# Patient Record
Sex: Male | Born: 1955 | Race: White | Hispanic: No | Smoking: Current every day smoker
Health system: Southern US, Community
[De-identification: ages and names within clinical notes are randomized; demographics above are authoritative.]

## PROBLEM LIST (undated history)

## (undated) DIAGNOSIS — J189 Pneumonia, unspecified organism: Secondary | ICD-10-CM

## (undated) DIAGNOSIS — I219 Acute myocardial infarction, unspecified: Secondary | ICD-10-CM

## (undated) DIAGNOSIS — I2510 Atherosclerotic heart disease of native coronary artery without angina pectoris: Secondary | ICD-10-CM

## (undated) HISTORY — PX: CARDIAC CATHETERIZATION: SHX172

## (undated) HISTORY — PX: OTHER SURGICAL HISTORY: SHX169

## (undated) HISTORY — PX: CORONARY ARTERY BYPASS GRAFT: SHX141

---

## 2005-03-08 ENCOUNTER — Inpatient Hospital Stay (HOSPITAL_COMMUNITY): Admission: AD | Admit: 2005-03-08 | Discharge: 2005-03-17 | Payer: Self-pay | Admitting: Cardiology

## 2005-04-26 ENCOUNTER — Encounter (HOSPITAL_COMMUNITY): Admission: RE | Admit: 2005-04-26 | Discharge: 2005-07-25 | Payer: Self-pay | Admitting: Cardiology

## 2010-07-05 ENCOUNTER — Emergency Department (HOSPITAL_COMMUNITY): Payer: BC Managed Care – PPO

## 2010-07-05 ENCOUNTER — Inpatient Hospital Stay (HOSPITAL_COMMUNITY)
Admission: EM | Admit: 2010-07-05 | Discharge: 2010-07-07 | DRG: 122 | Disposition: A | Discharge status: Home / Self Care | Payer: BC Managed Care – PPO | Attending: Cardiology | Admitting: Cardiology

## 2010-07-05 DIAGNOSIS — Z7982 Long term (current) use of aspirin: Secondary | ICD-10-CM

## 2010-07-05 DIAGNOSIS — R7989 Other specified abnormal findings of blood chemistry: Secondary | ICD-10-CM

## 2010-07-05 DIAGNOSIS — I25810 Atherosclerosis of coronary artery bypass graft(s) without angina pectoris: Secondary | ICD-10-CM | POA: Diagnosis present

## 2010-07-05 DIAGNOSIS — E785 Hyperlipidemia, unspecified: Secondary | ICD-10-CM | POA: Diagnosis present

## 2010-07-05 DIAGNOSIS — I214 Non-ST elevation (NSTEMI) myocardial infarction: Principal | ICD-10-CM | POA: Diagnosis present

## 2010-07-05 DIAGNOSIS — I2510 Atherosclerotic heart disease of native coronary artery without angina pectoris: Secondary | ICD-10-CM | POA: Diagnosis present

## 2010-07-05 DIAGNOSIS — F17200 Nicotine dependence, unspecified, uncomplicated: Secondary | ICD-10-CM | POA: Diagnosis present

## 2010-07-05 LAB — DIFFERENTIAL
Basophils Absolute: 0 10*3/uL (ref 0.0–0.1)
Basophils Relative: 0 % (ref 0–1)
Eosinophils Absolute: 0.2 10*3/uL (ref 0.0–0.7)
Neutrophils Relative %: 58 % (ref 43–77)

## 2010-07-05 LAB — CBC
MCH: 33.5 pg (ref 26.0–34.0)
Platelets: 191 10*3/uL (ref 150–400)
RBC: 4.84 MIL/uL (ref 4.22–5.81)
WBC: 13.7 10*3/uL — ABNORMAL HIGH (ref 4.0–10.5)

## 2010-07-05 LAB — POCT CARDIAC MARKERS
Myoglobin, poc: 37.7 ng/mL (ref 12–200)
Troponin i, poc: 1.18 ng/mL (ref 0.00–0.09)

## 2010-07-05 LAB — BASIC METABOLIC PANEL
BUN: 11 mg/dL (ref 6–23)
Chloride: 105 mEq/L (ref 96–112)
Creatinine, Ser: 0.86 mg/dL (ref 0.4–1.5)

## 2010-07-06 LAB — CARDIAC PANEL(CRET KIN+CKTOT+MB+TROPI)
CK, MB: 8.4 ng/mL (ref 0.3–4.0)
Relative Index: 4.1 — ABNORMAL HIGH (ref 0.0–2.5)
Total CK: 229 U/L (ref 7–232)
Total CK: 191 U/L (ref 7–232)
Troponin I: 2.19 ng/mL (ref 0.00–0.06)
Troponin I: 2.26 ng/mL (ref 0.00–0.06)

## 2010-07-06 LAB — HEPARIN LEVEL (UNFRACTIONATED): Heparin Unfractionated: <0.1 IU/mL — ABNORMAL LOW (ref 0.30–0.70)

## 2010-07-06 LAB — PROTIME-INR: Prothrombin Time: 12.5 seconds (ref 11.6–15.2)

## 2010-07-06 LAB — LIPID PANEL: Triglycerides: 80 mg/dL (ref <150)

## 2010-07-06 LAB — TSH: TSH: 1.62 u[IU]/mL (ref 0.350–4.500)

## 2010-07-07 LAB — CBC
HCT: 44.8 % (ref 39.0–52.0)
MCHC: 35.5 g/dL (ref 30.0–36.0)
MCV: 93.9 fL (ref 78.0–100.0)
RDW: 13 % (ref 11.5–15.5)

## 2010-07-07 LAB — BASIC METABOLIC PANEL
BUN: 9 mg/dL (ref 6–23)
Calcium: 8.9 mg/dL (ref 8.4–10.5)
GFR calc non Af Amer: >60 mL/min (ref >60)
Glucose, Bld: 98 mg/dL (ref 70–99)

## 2010-07-19 NOTE — Discharge Summary (Signed)
  NAME:  Erik Bridges, Erik Bridges             ACCOUNT NO.:  [id number]  MEDICAL RECORD NO.:  [id number]           PATIENT TYPE:  I  LOCATION:  2013                         FACILITY:  [facility]  PHYSICIAN:  Corky Crafts, MDDATE OF BIRTH:  1956/01/21  DATE OF ADMISSION:  07/05/2010 DATE OF DISCHARGE:  07/07/2010                              DISCHARGE SUMMARY   PRIMARY CARDIOLOGIST:  Armanda Magic, MD  FINAL DIAGNOSES: 1. Non-ST segment elevation myocardial infarction. 2. Coronary artery disease. 3. Tobacco abuse. 4. Hyperlipidemia.  PROCEDURE PERFORMED:  Cardiac catheterization by Dr. Verdis Prime, showing ejection fraction of 60% patent LIMA to LAD, patent SVG to PDA, which also provided collaterals to the distal circumflex, SVG to sequential OM, and circumflex was occluded after the anastomosis with the OM.  HOSPITAL COURSE:  The patient had had intermittent chest pain over several days prior to admission.  He went to his primary care doctor's office to check the troponin, and it was significantly elevated.  He came to the emergency room and was admitted.  He underwent cardiac catheterization showing an occluded vein grafts to the distal circumflex territory.  He did not have any further chest pain while in the hospital.  He was put on nicotine patches.  He was put on medical therapy.  He had not been taking any medications at home.  He admits to not being compliant with any of the instructions from his doctor's office.  He did find after the procedure, he had no groin pain or bleeding.  Overall, he felt well.  DISCHARGE MEDICATIONS: 1. Aspirin 325 mg daily. 2. Metoprolol succinate 25 mg daily. 3. Nicotine patch 21 mg per day transdermally daily. 4. Simvastatin 40 mg daily.  FOLLOWUP APPOINTMENTS:  With Dr. Mayford Knife in about 10 days.  ACTIVITY:  No lifting more than 10 pounds for 1 week, will be out of work until at least July 24, 2010.  DIET:  Low-sodium, heart-healthy  diet.  He is to stop smoking and take his medicines as prescribed.  We had a long discussion about this and he understands the importance of take care of himself.  LABORATORY DATA AT DISCHARGE:  LDL 106, HDL 34, triglycerides 80, total cholesterol 156.     Corky Crafts, MD     JSV/MEDQ  D:  07/07/2010  T:  07/08/2010  Job:  [id number]  Electronically Signed by Lance Muss MD on 07/19/2010 02:27:28 PM

## 2010-07-21 NOTE — Cardiovascular Report (Signed)
NAME:  Erik Bridges, Erik Bridges             ACCOUNT NO.:  [id number]  MEDICAL RECORD NO.:  [id number]           PATIENT TYPE:  I  LOCATION:  2013                         FACILITY:  [facility]  PHYSICIAN:  Lyn Records, M.D.   DATE OF BIRTH:  1955-04-09  DATE OF PROCEDURE: DATE OF DISCHARGE:                           CARDIAC CATHETERIZATION   INDICATIONS:  Non-ST-elevation myocardial infarction occurring on July 03, 2010.  PROCEDURE PERFORMED: 1. Left heart catheterization. 2. Selective coronary angiography. 3. Left ventriculography. 4. Saphenous vein graft angiography. 5. Left internal mammary artery graft angiography.  DESCRIPTION:  After informed consent, the patient was brought to the cath lab in the post absorptive state.  A 5-French sheath was placed in the right femoral artery using a modified Seldinger technique.  A 5- [place] A2 multipurpose catheter was used for hemodynamic recordings, left ventriculography by hand injection, saphenous vein graft angiography, and native vessel angiography.  A 5-French internal mammary catheter was used for left internal mammary graft angiography. Nitroglycerin 200 mcg was administered down the saphenous vein, sequential graft to the OM and distal circumflex.  No complications occurred during the procedure.  Angio-Seal was used for hemostasis.  RESULTS: 1. Hemodynamic data:     a.     Left ventricular pressure 108/8 mmHg.     b.     Aortic pressure 108/67 mmHg. 2. Left ventriculography:  Left ventricular cavity size is normal.  EF     is 60%.  There is possible mid inferior wall mild hypokinesis. 3. Coronary angiography.     a.     Left main coronary artery:  Widely patent.     b.     Left anterior descending coronary artery:  Occluded in the      proximal segment.     c.     Circumflex artery:  Occluded in the mid segment.     d.     Right coronary artery:  Occluded proximally. 4. Bypass graft angiography.     a.     LIMA graft to LAD:   Widely patent, LAD wraps around the left      ventricular apex.  Proximal flow to the graft insertion site is      also noted back to the proximal LAD segment.     b.     Saphenous vein graft to the PDA:  Widely patent.  The PDA      supplies collaterals to the distal circumflex.  This helps to      visualize the occluded graft segment noted beyond the first obtuse      marginal.     c.     Saphenous vein graft sequential to OM and distal circumflex:      This graft is patent and an anastomosis to the first obtuse      marginal in a side-to-side fashion.  The graft is occluded beyond      this side-to-side anastomoses.  As mentioned earlier, the distal      circumflex is supplied by collaterals from the right coronary      territory.  CONCLUSIONS: 1. Bypass graft occlusion with  100% obstruction of the distal limb of     the saphenous vein graft to the circumflex. 2. Patent saphenous vein graft to the right coronary and LIMA to the     LAD. 3. Overall normal LV function with EF of 55-60%. 4. Severe native vessel coronary artery disease with total occlusion     of the LAD, circumflex, and right coronary.  PLAN:  The patient will be in the hospital overnight.  He should have aggressive risk factor modification.  I have counseled him about smoking cessation.  We will get cardiac rehab to work with him.     Lyn Records, M.D.     HWS/MEDQ  D:  07/06/2010  T:  07/07/2010  Job:  [id number]  cc:   Armanda Magic, M.D.  Electronically Signed by Verdis Prime M.D. on 07/21/2010 04:57:38 PM

## 2010-07-28 NOTE — H&P (Signed)
NAME:  Erik Bridges, Erik Bridges             ACCOUNT NO.:  [id number]  MEDICAL RECORD NO.:  [id number]           PATIENT TYPE:  E  LOCATION:  [facility]                         FACILITY:  [facility]  PHYSICIAN:  Rollene Rotunda, MD, FACCDATE OF BIRTH:  05-Sep-1955  DATE OF ADMISSION:  07/05/2010 DATE OF DISCHARGE:                             HISTORY & PHYSICAL   PRIMARY CARE PHYSICIAN:  Robert L. Foy Guadalajara, MD  CARDIOLOGIST:  Armanda Magic, MD  REASON FOR PRESENTATION:  Evaluate the patient with elevated cardiac enzymes.  HISTORY OF PRESENT ILLNESS:  The patient is a pleasant 55 year old gentleman with a past history of coronary artery disease and failed angioplasty in 2006.  He had subsequent CABG.  Since that time, he has not really been participating in secondary risk reduction.  He says he has a lot of stress in his life.  He still smokes a pack of cigarettes per day.  On Monday, he was not doing anything particularly exerting. He had an episode of chest discomfort.  It was substernal.  It was 7/10 in intensity.  It was not like his previous angina.  He did not have associated diaphoresis, nausea, or vomiting.  He did not have any particular shortness of breath.  There was some jaw discomfort.  He subsequently presented then to his primary provider.  Cardiac enzymes were drawn yesterday.  Apparently, these were elevated and he was called and told to come to the emergency room.  He has had no further chest discomfort.  He has been back to work.  He has had no new shortness of breath, PND, or orthopnea.  He has had no palpitations, presyncope, or syncope.  Of note, his cardiac enzymes are mildly elevated at 1.10 here in the emergency room.  EKG demonstrates some subtle inferior ST-segment elevation, not diagnostic.  PAST MEDICAL HISTORY:  Coronary artery disease.  PAST SURGICAL HISTORY:  CABG in 2006 (LIMA to the LAD, SVG to the PDA, SVG to OM/circumflex), jaw abscess drained.  ALLERGIES:   None.  MEDICATIONS:  Aspirin intermittently.  SOCIAL HISTORY:  The patient is going through separation.  He is in the room with his children.  He smokes 1 pack per day and has done so for 35 years.  He works Interior and spatial designer.  FAMILY HISTORY:  Contributory for his father having bypass at age 54.  REVIEW OF SYSTEMS:  As stated in HPI, otherwise negative for all other systems.  PHYSICAL EXAMINATION:  GENERAL:  The patient is a pleasant and in no distress. VITAL SIGNS:  Blood pressure 111/64, heart rate 79 and regular, afebrile, respiratory rate 16. HEENT: Eyes unremarkable.  Pupils equal, round, and reactive to light. Fundi not visualized.  Oral mucosa unremarkable. NECK:  No jugular venous distention at 45 degrees.  Carotid upstroke brisk and symmetric.  No bruits, no thyromegaly. LYMPHATICS:  No cervical, axillary, or inguinal adenopathy. LUNGS:  Clear to auscultation bilaterally. BACK:  No costovertebral tenderness. CHEST:  Well-healed sternotomy scar. HEART:  PMI not displaced or sustained.  S1 and S2 within normal limits. No S3, no S4, no clicks, no rubs, no murmurs. ABDOMEN:  Flat.  Positive bowel  sounds.  Normal frequency and pitch.  No bruits, no rebound, no guarding, no midline pulsatile mass, no hepatomegaly, no splenomegaly. SKIN:  No rashes, no nodules.Marland Kitchen EXTREMITIES:  2+ pulses throughout.  No edema, cyanosis, or clubbing. NEUROLOGIC:  Oriented to person, place, and time.  Cranial nerves II-XII grossly intact. Motor grossly intact.  EKG, sinus rhythm, rate 85, axis within normal limits, intervals within normal limits, inferior T-wave inversions considered with possible ischemia.  LABS:  Sodium 135, potassium 3.7, BUN 11, creatinine 0.86.  WBC 13.7, hemoglobin 9.2, platelets 191.  Point-of-care markers MB 7.1, troponin 1.16.  Chest x-ray, no acute disease.  ASSESSMENT AND PLAN: 1. Out-of-hospital myocardial infarction.  The patient seems to have     had an  out-of-hospital non-Q-wave myocardial infarction.  He is     currently pain free.  He has significant ongoing risk factors.  He     will be admitted.  I will use heparin, aspirin, and a very low-dose     beta blocker.  He will need cardiac catheterization for further     evaluation.  I will take the liberty of scheduling this with the     [facility]. 2. Tobacco.  We had a long discussion about how to stop smoking and     the need to do this and hopefully he could commit to this. 3. Dyslipidemia.  He says his lipids were fine in the past.  I will     check a lipid profile.  We will empirically start a statin.     Rollene Rotunda, MD, [facility]     JH/MEDQ  D:  07/06/2010  T:  07/06/2010  Job:  [id number]  cc:   Molly Maduro L. Foy Guadalajara, M.D.  Electronically Signed by Rollene Rotunda MD [facility] on 07/28/2010 11:46:19 AM

## 2011-05-04 ENCOUNTER — Encounter (INDEPENDENT_AMBULATORY_CARE_PROVIDER_SITE_OTHER): Payer: Self-pay

## 2011-05-04 ENCOUNTER — Ambulatory Visit (INDEPENDENT_AMBULATORY_CARE_PROVIDER_SITE_OTHER): Payer: BC Managed Care – PPO | Admitting: General Surgery

## 2011-05-04 ENCOUNTER — Encounter (INDEPENDENT_AMBULATORY_CARE_PROVIDER_SITE_OTHER): Payer: Self-pay | Admitting: General Surgery

## 2011-05-04 VITALS — BP 140/86 | HR 70 | Temp 97.6°F | Resp 16 | Ht 69.0 in | Wt 147.2 lb

## 2011-05-04 DIAGNOSIS — K4090 Unilateral inguinal hernia, without obstruction or gangrene, not specified as recurrent: Secondary | ICD-10-CM

## 2011-05-04 NOTE — Progress Notes (Signed)
Patient ID: Erik Bridges, male   DOB: [birth year], 56 y.o.   MRN: [id number]  Chief Complaint  Patient presents with  . Pain    Evaluate Righ inguinal hernia    HPI Erik Bridges is a 56 y.o. male.   HPI This patient presents for evaluation of a right inguinal bulge. He states that he has noticed this for several years and has not caused him any problems or discomfort although he states that is increasing in size. He states it is reducible. He denies any obstructive symptoms. Of note, he did have a 2 vessel bypass surgery in 2006 and had a "minor heart attack" last April of 2012. He states he is active now and recently went snowboarding last week and denies any chest pain. He is followed currently by Dr. Mayford Knife.  No past medical history on file. PMH: CAD and MI  No past surgical history on file. PSH: cabg   No family history on file.  Social History History  Substance Use Topics  . Smoking status: Current Everyday Smoker    Types: Cigarettes  . Smokeless tobacco: Not on file  . Alcohol Use: 0.5 - 1.0 oz/week    1-2 drink(s) per week    Allergies not on file  No current outpatient prescriptions on file.    Review of Systems Review of Systems All other review of systems negative or noncontributory except as stated in the HPI   Blood pressure 140/86, pulse 70, temperature 97.6 F (36.4 C), temperature source Temporal, resp. rate 16, height 5\' 9"  (1.753 m), weight 147 lb 3.2 oz (66.769 kg).  Physical Exam Physical Exam Physical Exam  Vitals reviewed. Constitutional: He is oriented to person, place, and time. He appears well-developed and well-nourished. No distress.  HENT:  Head: Normocephalic and atraumatic.  Mouth/Throat: No oropharyngeal exudate.  Eyes: Conjunctivae and EOM are normal. Pupils are equal, round, and reactive to light. Right eye exhibits no discharge. Left eye exhibits no discharge. No scleral icterus.  Neck: Normal range of motion. No tracheal  deviation present.  Cardiovascular: Normal rate, regular rhythm and normal heart sounds.   Pulmonary/Chest: Effort normal and breath sounds normal. No stridor. No respiratory distress. He has no wheezes. He has no rales. He exhibits no tenderness.  Abdominal: Soft. Bowel sounds are normal. He exhibits no distension and no mass. There is no tenderness. There is no rebound and no guarding. Small to moderate size reducible RIH, no LIH. Musculoskeletal: Normal range of motion. He exhibits no edema and no tenderness.  Neurological: He is alert and oriented to person, place, and time.  Skin: Skin is warm and dry. No rash noted. He is not diaphoretic. No erythema. No pallor.  Psychiatric: He has a normal mood and affect. His behavior is normal. Judgment and thought content normal.   Data Reviewed   Assessment    Reducible right inguinal hernia  He does have a small to moderate-sized reducible right inguinal hernia on exam. This is currently asymptomatic but he states it is enlarging in size and this is recently lost his insurance and would like to have this repaired soon. I discussed with him the options for lateral waiting versus laparoscopic or open repair and he would like to have this repaired. We discussed the risks of procedure including infection, bleeding, pain, scarring, recurrent hernia, injury to testicle or vas deferens, nerve injury and chronic pain and he expressed understanding and desire to proceed with open inguinal hernia repair. Given the fact  that he had recently had a heart attack, have requested that he get clearance from his cardiologist Dr. Mayford Knife prior to scheduling this procedure. I recommended open repair as this would give anesthesia more options including the possibility of possibly doing this with local and sedation if he is to risk from a cardiac standpoint.    Plan    We will plan for open right inguinal hernia repair with mesh after he received his cardiac clearance.        Lodema Pilot DAVID 05/04/2011, 2:06 PM

## 2011-05-19 NOTE — Pre-Procedure Instructions (Signed)
[address] Carly[address] Mcelrath Beldin  05/19/2011     Your procedure is scheduled on:  Tuesday, Feb. 19th  Report to [facility] at  8:15 AM.   Call this number if you have problems the morning of surgery: [phone]   Remember:   Do not eat food:After Midnight Monday.   May have clear liquids: up to 4 Hours before arrival time-- 4:15 AM.  Clear liquids include soda, tea, black coffee, apple or grape juice, broth.   Take these medicines the morning of surgery with A SIP OF WATER: nothing   Do not wear jewelry, Do not wear lotions, powders, or perfumes. You may wear deodorant.    Do not bring valuables to the hospital.   Contacts, dentures or bridgework may not be worn into surgery.   Leave suitcase in the car. After surgery it may be brought to your room.  For patients admitted to the hospital, checkout time is 11:00 AM the day of discharge.   Patients discharged the day of surgery will not be allowed to drive home, and will              Need someone to stay with them for the first 24 hrs.   Name and phone number of your driver:  DARRYL  Bertino ---  SON   Special Instructions: CHG Shower Use Special Wash: 1/2 bottle night before surgery and 1/2 bottle morning of surgery.   Please read over the following fact sheets that you were given: Pain Booklet, MRSA Information and Surgical Site Infection Prevention

## 2011-05-21 ENCOUNTER — Encounter (HOSPITAL_COMMUNITY)
Admission: RE | Admit: 2011-05-21 | Discharge: 2011-05-21 | Disposition: A | Discharge status: Home / Self Care | Payer: BC Managed Care – PPO | Source: Ambulatory Visit | Attending: General Surgery | Admitting: General Surgery

## 2011-05-21 ENCOUNTER — Encounter (HOSPITAL_COMMUNITY): Payer: Self-pay

## 2011-05-21 DIAGNOSIS — K4090 Unilateral inguinal hernia, without obstruction or gangrene, not specified as recurrent: Secondary | ICD-10-CM

## 2011-05-21 HISTORY — DX: Acute myocardial infarction, unspecified: I21.9

## 2011-05-21 HISTORY — DX: Atherosclerotic heart disease of native coronary artery without angina pectoris: I25.10

## 2011-05-21 LAB — COMPREHENSIVE METABOLIC PANEL
ALT: 13 U/L (ref 0–53)
Albumin: 4.3 g/dL (ref 3.5–5.2)
Calcium: 9.6 mg/dL (ref 8.4–10.5)
GFR calc Af Amer: >90 mL/min (ref >90)
Glucose, Bld: 91 mg/dL (ref 70–99)
Potassium: 4.2 mEq/L (ref 3.5–5.1)
Sodium: 139 mEq/L (ref 135–145)
Total Protein: 7.8 g/dL (ref 6.0–8.3)

## 2011-05-21 LAB — SURGICAL PCR SCREEN
MRSA, PCR: NEGATIVE
Staphylococcus aureus: NEGATIVE

## 2011-05-21 LAB — CBC
HCT: 48.7 % (ref 39.0–52.0)
Hemoglobin: 16.8 g/dL (ref 13.0–17.0)
MCHC: 34.5 g/dL (ref 30.0–36.0)
MCV: 94.9 fL (ref 78.0–100.0)
WBC: 11.1 10*3/uL — ABNORMAL HIGH (ref 4.0–10.5)

## 2011-05-21 MED ORDER — CEFAZOLIN SODIUM 1-5 GM-% IV SOLN
1.0000 g | INTRAVENOUS | Status: DC
  Filled 2011-05-21: qty 50

## 2011-05-21 NOTE — Progress Notes (Signed)
PATIENT STATES HE'S BEEN FINE .Marland KitchenNO CHEST PAIN, ANGINA... HE DID SAY HE THINKS HE HAD STRESS TEST OVER AT [facility].  UNABLE TO LOCATE ANY  INFO IN EPIC..   I HAVE CALLED THE MED RECORDS DEPT TO SEE IF THEY HAVE ANYTHING......

## 2011-05-22 ENCOUNTER — Encounter (HOSPITAL_COMMUNITY): Payer: Self-pay

## 2011-05-22 ENCOUNTER — Encounter (HOSPITAL_COMMUNITY): Payer: Self-pay | Admitting: Anesthesiology

## 2011-05-22 ENCOUNTER — Ambulatory Visit (HOSPITAL_COMMUNITY)
Admission: RE | Admit: 2011-05-22 | Discharge: 2011-05-22 | Disposition: A | Discharge status: Home / Self Care | Payer: BC Managed Care – PPO | Source: Ambulatory Visit | Attending: General Surgery | Admitting: General Surgery

## 2011-05-22 ENCOUNTER — Encounter (HOSPITAL_COMMUNITY)
Admission: RE | Disposition: A | Discharge status: Home / Self Care | Payer: Self-pay | Source: Ambulatory Visit | Attending: General Surgery

## 2011-05-22 DIAGNOSIS — Z538 Procedure and treatment not carried out for other reasons: Secondary | ICD-10-CM | POA: Insufficient documentation

## 2011-05-22 DIAGNOSIS — K4090 Unilateral inguinal hernia, without obstruction or gangrene, not specified as recurrent: Secondary | ICD-10-CM

## 2011-05-22 SURGERY — REPAIR, HERNIA, INGUINAL, ADULT
Anesthesia: General

## 2011-05-22 SURGICAL SUPPLY — 43 items
ADH SKN CLS APL DERMABOND .7 (GAUZE/BANDAGES/DRESSINGS)
BLADE SURG 10 STRL SS (BLADE) ×1 IMPLANT
BLADE SURG 15 STRL LF DISP TIS (BLADE) ×1 IMPLANT
BLADE SURG 15 STRL SS (BLADE)
BLADE SURG ROTATE 9660 (MISCELLANEOUS) IMPLANT
CANISTER SUCTION 2500CC (MISCELLANEOUS) ×1 IMPLANT
CHLORAPREP W/TINT 26ML (MISCELLANEOUS) ×1 IMPLANT
CLOTH BEACON ORANGE TIMEOUT ST (SAFETY) ×1 IMPLANT
COVER SURGICAL LIGHT HANDLE (MISCELLANEOUS) ×1 IMPLANT
DERMABOND ADVANCED (GAUZE/BANDAGES/DRESSINGS)
DERMABOND ADVANCED .7 DNX12 (GAUZE/BANDAGES/DRESSINGS) ×1 IMPLANT
DRAIN PENROSE 1/2X12 LTX STRL (WOUND CARE) IMPLANT
DRAPE LAPAROSCOPIC ABDOMINAL (DRAPES) ×1 IMPLANT
ELECT CAUTERY BLADE 6.4 (BLADE) ×1 IMPLANT
ELECT REM PT RETURN 9FT ADLT (ELECTROSURGICAL)
ELECTRODE REM PT RTRN 9FT ADLT (ELECTROSURGICAL) ×1 IMPLANT
GLOVE SURG SS PI 7.5 STRL IVOR (GLOVE) ×2 IMPLANT
GOWN PREVENTION PLUS XLARGE (GOWN DISPOSABLE) ×1 IMPLANT
GOWN STRL NON-REIN LRG LVL3 (GOWN DISPOSABLE) ×1 IMPLANT
KIT BASIN OR (CUSTOM PROCEDURE TRAY) ×1 IMPLANT
KIT ROOM TURNOVER OR (KITS) ×1 IMPLANT
NDL HYPO 25GX1X1/2 BEV (NEEDLE) ×1 IMPLANT
NEEDLE HYPO 25GX1X1/2 BEV (NEEDLE) IMPLANT
NS IRRIG 1000ML POUR BTL (IV SOLUTION) ×1 IMPLANT
PACK SURGICAL SETUP 50X90 (CUSTOM PROCEDURE TRAY) ×1 IMPLANT
PAD ARMBOARD 7.5X6 YLW CONV (MISCELLANEOUS) ×2 IMPLANT
PENCIL BUTTON HOLSTER BLD 10FT (ELECTRODE) ×1 IMPLANT
SPECIMEN JAR SMALL (MISCELLANEOUS) IMPLANT
SPONGE INTESTINAL PEANUT (DISPOSABLE) ×1 IMPLANT
SPONGE LAP 18X18 X RAY DECT (DISPOSABLE) ×1 IMPLANT
SUT MNCRL AB 4-0 PS2 18 (SUTURE) ×1 IMPLANT
SUT PROLENE 2 0 SH DA (SUTURE) ×4 IMPLANT
SUT VIC AB 2-0 SH 27 (SUTURE)
SUT VIC AB 2-0 SH 27XBRD (SUTURE) ×2 IMPLANT
SUT VIC AB 3-0 SH 27 (SUTURE)
SUT VIC AB 3-0 SH 27X BRD (SUTURE) ×1 IMPLANT
SYR BULB 3OZ (MISCELLANEOUS) ×1 IMPLANT
SYR CONTROL 10ML LL (SYRINGE) ×1 IMPLANT
TOWEL OR 17X24 6PK STRL BLUE (TOWEL DISPOSABLE) ×1 IMPLANT
TOWEL OR 17X26 10 PK STRL BLUE (TOWEL DISPOSABLE) ×1 IMPLANT
TUBE CONNECTING 12X1/4 (SUCTIONS) IMPLANT
WATER STERILE IRR 1000ML POUR (IV SOLUTION) IMPLANT
YANKAUER SUCT BULB TIP NO VENT (SUCTIONS) IMPLANT

## 2011-05-22 NOTE — Progress Notes (Signed)
Report to Dr. Chaney Malling regarding correspondence rec'd fr. Dr. Mayford Knife. Dr. Chaney Malling asked that pt. be held in [facility], he will contact Dr. Biagio Quint for next step to be followed.

## 2011-05-22 NOTE — Progress Notes (Signed)
Spoke with Amy in [facility], requested cardiac note & any stress/echo that would be avail.

## 2011-05-22 NOTE — Progress Notes (Signed)
Call to Dr. Chaney Malling, reported pt. Remark that he has not been taking Metoprolol for 2 months ( for financial reasons has not filled Rx) .  Further, pt. remarks Dr. Mayford Knife told Dr. Biagio Quint that he may need Nitro IV intraoperatively.   Call again to [facility], spoke with Burna Mortimer, requested correspondence with Dr. Mayford Knife to Dr. Biagio Quint for surg. clearance. Spoke with AMaureen Chatters regarding this situation as well.

## 2011-05-22 NOTE — Progress Notes (Addendum)
Pt. Seen by Dr. Biagio Quint, decision reached to cancel surgery.  Pt. Left with his son to follow up with taking medicine as previously rx'd by cardiologist.  Surgery was cancelled by anesthesia because the patient was high risk and he had been noncompliant with metoprolol.  He has a small asymptomatic hernia and we discussed the signs of incarceration and strangulation. He will return if any of these and if he desires elective repair, he will need to reestablish with his cardiologist for optimization.

## 2012-09-23 IMAGING — CR DG CHEST 2V
2 series · 2 of 2 positions shown · non-contrast
Comparison: 03/16/2005

CLINICAL DATA: Chest pain

CHEST - 2 VIEW

[w chest pa]
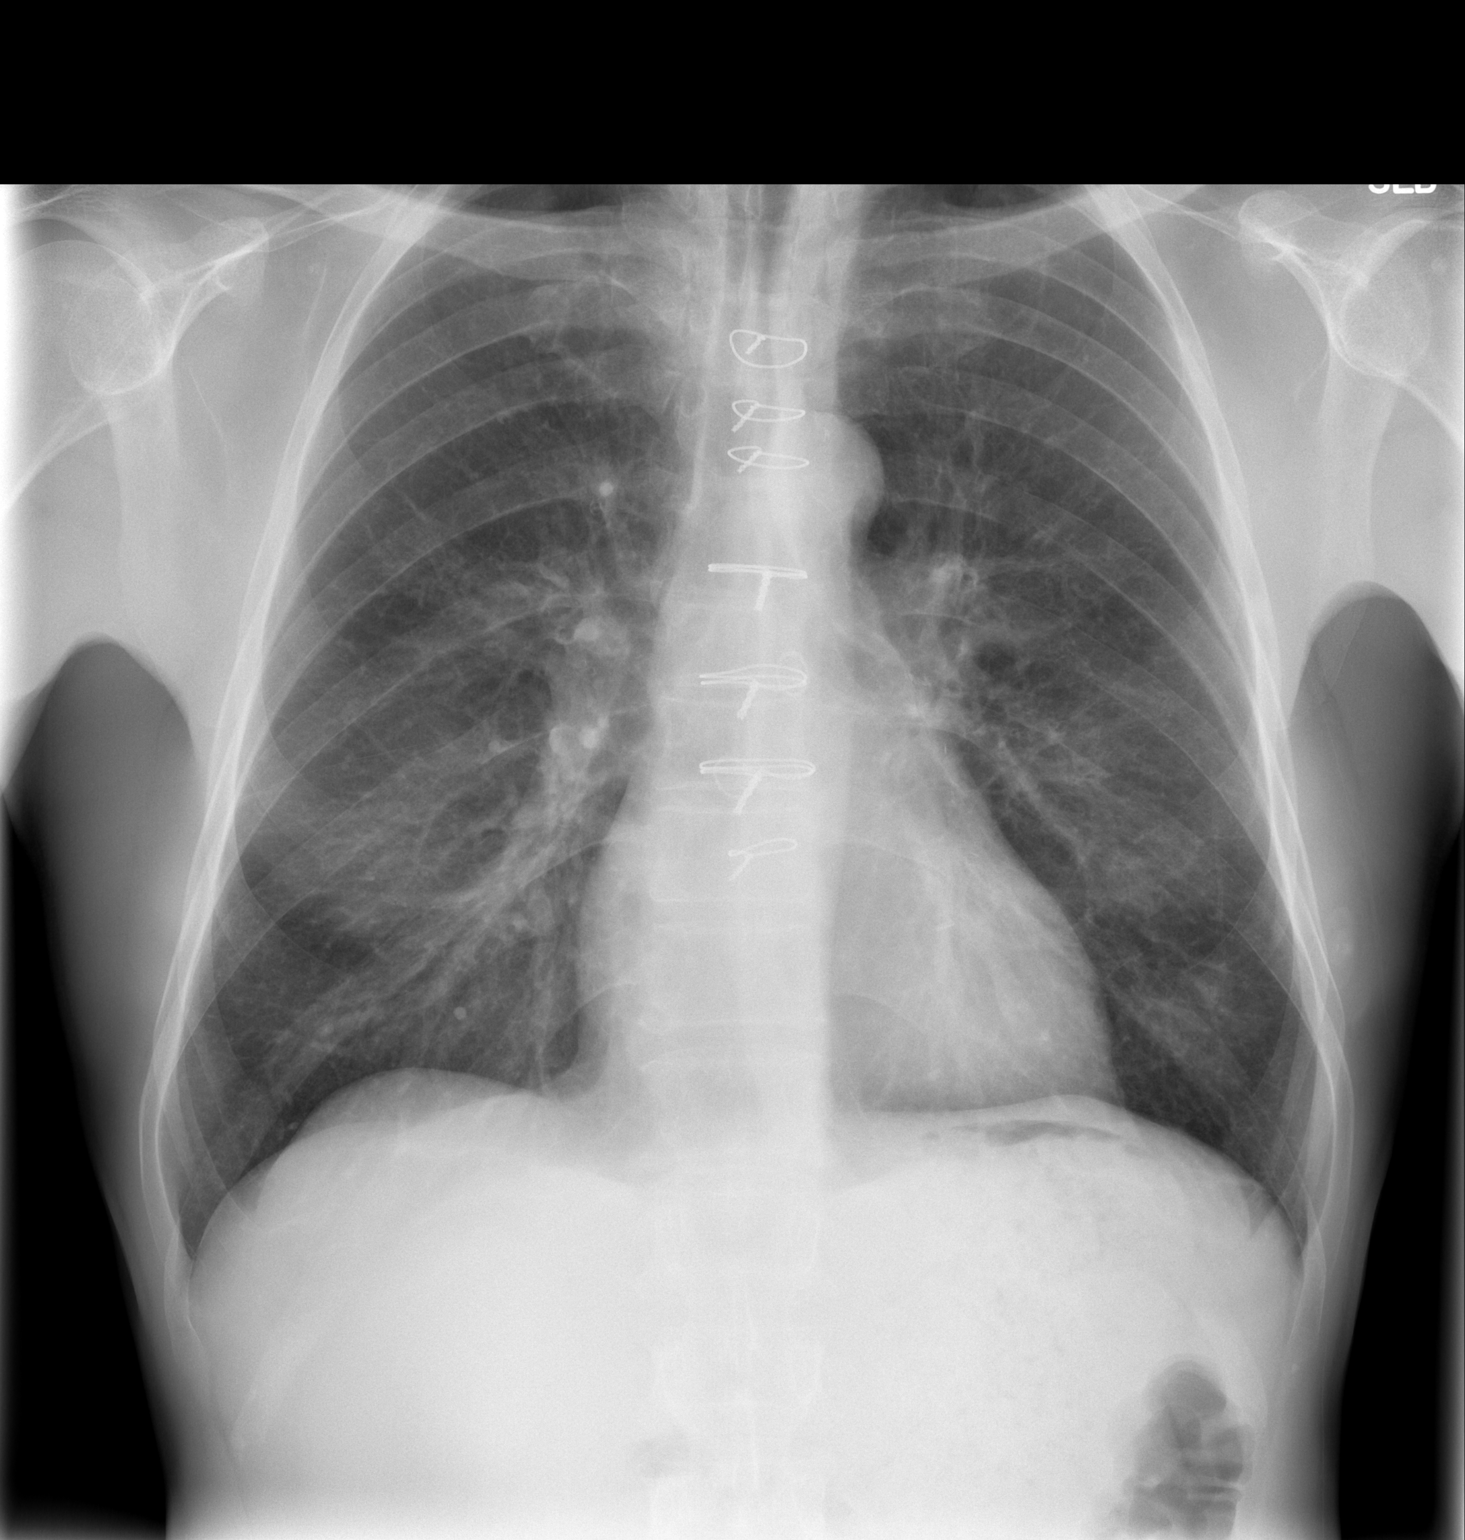

[w chest lat]
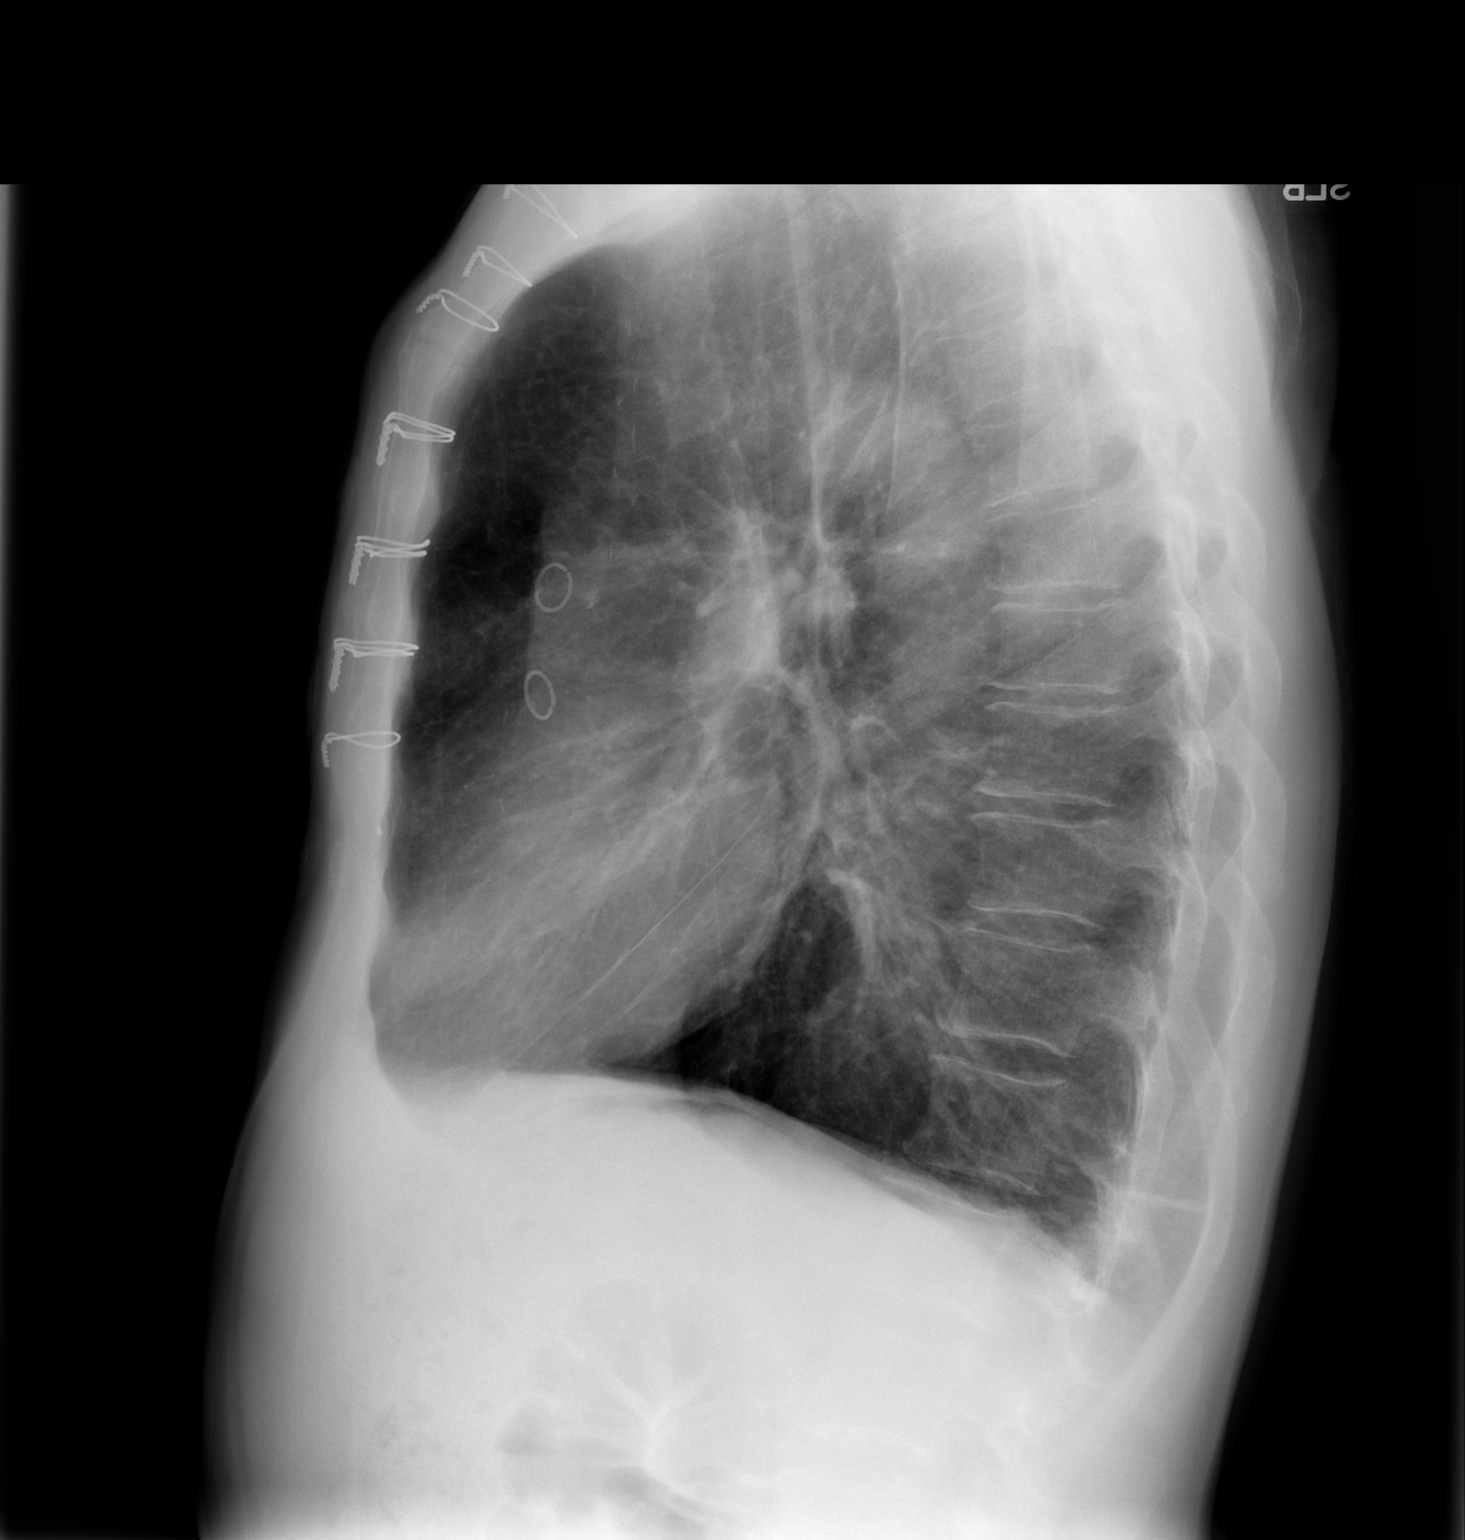

[2 of 2 positions shown; findings below may reference images not displayed]

FINDINGS: Status post median sternotomy and CABG procedure The
heart size and mediastinal contours are within normal limits.  Both
lungs are clear.  The visualized skeletal structures are
unremarkable.
IMPRESSION: 1.  No acute findings.

## 2014-04-15 ENCOUNTER — Encounter (HOSPITAL_COMMUNITY): Payer: Self-pay | Admitting: General Surgery

## 2020-06-08 ENCOUNTER — Ambulatory Visit: Payer: Self-pay | Admitting: General Surgery

## 2020-06-08 NOTE — H&P (View-Only) (Signed)
History of Present Illness Erik Filler MD; 06/08/2020 2:14 PM) The patient is a 65 year old male who presents with an inguinal hernia. Chief Complaint: Bilateral inguinal hernias  Patient is a 65 year old male, with a history of PVD, status post aortobifem bypass, who comes in with bilateral inguinal hernias. Patient states his right inguinal hernias been there for several years. He states that to 3 months ago a left inguinal hernia.Marland Kitchen He states this is slowly getting larger. He states that he has been wearing a bilateral truss to help with the discomfort. Patient is self-employed and works as a Curator.  Patient does smoke 1 pack per day.  Patient had no signs or symptoms of incarceration or granulation. Patient has a large midline incision, with bilateral femoral incisions.    Past Surgical History Rosezella Florida, RN; 06/08/2020 1:58 PM) Bypass Surgery for Poor Blood Flow to Legs   Diagnostic Studies History Rosezella Florida, RN; 06/08/2020 1:58 PM) Colonoscopy  never  Allergies Rosezella Florida, RN; 06/08/2020 1:58 PM) No Known Drug Allergies  [06/08/2020]: Allergies Reconciled   Medication History (Diane Herrin, RN; 06/08/2020 2:00 PM) Metoprolol Succinate ER (25MG  Tablet ER 24HR, Oral) Active. Simvastatin (40MG  Tablet, Oral) Active. Aspirin (81MG  Tablet Chewable, Oral) Active. Nitroglycerin (0.4MG  Tab Sublingual, Sublingual) Active. Medications Reconciled  Social History , RN; 06/08/2020 1:58 PM) Alcohol use  Moderate alcohol use. Caffeine use  Coffee. Illicit drug use  Uses socially only. Tobacco use  Current every day smoker.  Family History , RN; 06/08/2020 1:58 PM) Heart disease in male family member before age 73   Other Problems Rosezella Florida, RN; 06/08/2020 1:58 PM) Inguinal Hernia  Myocardial infarction     Review of Systems 54 MD; 06/08/2020 2:12 PM) General Not Present- Appetite Loss, Chills, Fatigue, Fever, Night  Sweats, Weight Gain and Weight Loss. Skin Not Present- Change in Wart/Mole, Dryness, Hives, Jaundice, New Lesions, Non-Healing Wounds, Rash and Ulcer. HEENT Not Present- Earache, Hearing Loss, Hoarseness, Nose Bleed, Oral Ulcers, Ringing in the Ears, Seasonal Allergies, Sinus Pain, Sore Throat, Visual Disturbances, Wears glasses/contact lenses and Yellow Eyes. Respiratory Not Present- Bloody sputum, Chronic Cough, Difficulty Breathing, Snoring and Wheezing. Cardiovascular Not Present- Chest Pain, Difficulty Breathing Lying Down, Leg Cramps, Palpitations, Rapid Heart Rate, Shortness of Breath and Swelling of Extremities. Gastrointestinal Not Present- Abdominal Pain, Bloating, Bloody Stool, Change in Bowel Habits, Chronic diarrhea, Constipation, Difficulty Swallowing, Excessive gas, Gets full quickly at meals, Hemorrhoids, Indigestion, Nausea, Rectal Pain and Vomiting. Male Genitourinary Not Present- Blood in Urine, Change in Urinary Stream, Frequency, Impotence, Nocturia, Painful Urination, Urgency and Urine Leakage. Musculoskeletal Not Present- Back Pain, Joint Pain, Joint Stiffness, Muscle Pain, Muscle Weakness and Swelling of Extremities. Neurological Not Present- Decreased Memory, Fainting, Headaches, Numbness, Seizures, Tingling, Tremor, Trouble walking and Weakness. Psychiatric Not Present- Anxiety, Bipolar, Change in Sleep Pattern, Depression, Fearful and Frequent crying. Endocrine Not Present- Cold Intolerance, Excessive Hunger, Hair Changes, Heat Intolerance, Hot flashes and New Diabetes. Hematology Present- Blood Thinners. Not Present- Easy Bruising, Excessive bleeding, Gland problems, HIV and Persistent Infections. All other systems negative  Vitals (Diane Herrin RN; 06/08/2020 2:01 PM) 06/08/2020 2:00 PM Weight: 143.13 lb Height: 69in Body Surface Area: 1.79 m Body Mass Index: 21.14 kg/m  Temp.: 98.45F  Pulse: 88 (Regular)  P.OX: 97% (Room air) BP: 164/86(Sitting, Left Arm,  Standard)       Physical Exam 08/08/2020 MD; 06/08/2020 2:14 PM) The physical exam findings are as follows: Note: Constitutional: No acute distress, conversant, appears stated  age  Eyes: Anicteric sclerae, moist conjunctiva, no lid lag  Neck: No thyromegaly, trachea midline, no cervical lymphadenopathy  Lungs: Clear to auscultation biilaterally, normal respiratory effot  Cardiovascular: regular rate & rhythm, no murmurs, no peripheal edema, pedal pulses 2+  GI: Soft, no masses or hepatosplenomegaly, non-tender to palpation  MSK: Normal gait, no clubbing cyanosis, edema  Skin: No rashes, palpation reveals normal skin turgor  Psychiatric: Appropriate judgment and insight, oriented to person, place, and time  Abdomen Inspection Hernias - Bilateral - Inguinal hernia - Reducible - Bilateral.    Assessment & Plan Erik Filler MD; 06/08/2020 2:15 PM) BILATERAL INGUINAL HERNIA WITHOUT OBSTRUCTION OR GANGRENE, RECURRENCE NOT SPECIFIED (K40.20) Impression: Patient is a 65 year old male, with history of PVD, bilateral inguinal hernias, status post aortobifem bypass  I long discussion with the patient regards to his smoking. I discussed with them that minimizing his smoking between now and surgery will be helpful. I did discuss with him that with his smoking history is a higher chance of recurrence of his hernias.   1. The patient will like to proceed to the operating room for open bilateral inguinal hernia repair with mesh.  2. I discussed with the patient the signs and symptoms of incarceration and strangulation and the need to proceed to the ER should they occur.  3. I discussed with the patient the risks and benefits of the procedure to include but not limited to: Infection, bleeding, damage to surrounding structures, possible need for further surgery, possible nerve pain, and possible recurrence. The patient was understanding and wishes to proceed.

## 2020-06-08 NOTE — H&P (Signed)
History of Present Illness (Erik Ogawa MD; 06/08/2020 2:14 PM) The patient is a 64 year old male who presents with an inguinal hernia. Chief Complaint: Bilateral inguinal hernias  Patient is a 64-year-old male, with a history of PVD, status post aortobifem bypass, who comes in with bilateral inguinal hernias. Patient states his right inguinal hernias been there for several years. He states that to 3 months ago a left inguinal hernia.. He states this is slowly getting larger. He states that he has been wearing a bilateral truss to help with the discomfort. Patient is self-employed and works as a mechanic.  Patient does smoke 1 pack per day.  Patient had no signs or symptoms of incarceration or granulation. Patient has a large midline incision, with bilateral femoral incisions.    Past Surgical History (Diane Herrin, RN; 06/08/2020 1:58 PM) Bypass Surgery for Poor Blood Flow to Legs   Diagnostic Studies History (Diane Herrin, RN; 06/08/2020 1:58 PM) Colonoscopy  never  Allergies (Diane Herrin, RN; 06/08/2020 1:58 PM) No Known Drug Allergies  [06/08/2020]: Allergies Reconciled   Medication History (Diane Herrin, RN; 06/08/2020 2:00 PM) Metoprolol Succinate ER (25MG Tablet ER 24HR, Oral) Active. Simvastatin (40MG Tablet, Oral) Active. Aspirin (81MG Tablet Chewable, Oral) Active. Nitroglycerin (0.4MG Tab Sublingual, Sublingual) Active. Medications Reconciled  Social History (Diane Herrin, RN; 06/08/2020 1:58 PM) Alcohol use  Moderate alcohol use. Caffeine use  Coffee. Illicit drug use  Uses socially only. Tobacco use  Current every day smoker.  Family History (Diane Herrin, RN; 06/08/2020 1:58 PM) Heart disease in male family member before age 55   Other Problems (Diane Herrin, RN; 06/08/2020 1:58 PM) Inguinal Hernia  Myocardial infarction     Review of Systems (Faizah Kandler MD; 06/08/2020 2:12 PM) General Not Present- Appetite Loss, Chills, Fatigue, Fever, Night  Sweats, Weight Gain and Weight Loss. Skin Not Present- Change in Wart/Mole, Dryness, Hives, Jaundice, New Lesions, Non-Healing Wounds, Rash and Ulcer. HEENT Not Present- Earache, Hearing Loss, Hoarseness, Nose Bleed, Oral Ulcers, Ringing in the Ears, Seasonal Allergies, Sinus Pain, Sore Throat, Visual Disturbances, Wears glasses/contact lenses and Yellow Eyes. Respiratory Not Present- Bloody sputum, Chronic Cough, Difficulty Breathing, Snoring and Wheezing. Cardiovascular Not Present- Chest Pain, Difficulty Breathing Lying Down, Leg Cramps, Palpitations, Rapid Heart Rate, Shortness of Breath and Swelling of Extremities. Gastrointestinal Not Present- Abdominal Pain, Bloating, Bloody Stool, Change in Bowel Habits, Chronic diarrhea, Constipation, Difficulty Swallowing, Excessive gas, Gets full quickly at meals, Hemorrhoids, Indigestion, Nausea, Rectal Pain and Vomiting. Male Genitourinary Not Present- Blood in Urine, Change in Urinary Stream, Frequency, Impotence, Nocturia, Painful Urination, Urgency and Urine Leakage. Musculoskeletal Not Present- Back Pain, Joint Pain, Joint Stiffness, Muscle Pain, Muscle Weakness and Swelling of Extremities. Neurological Not Present- Decreased Memory, Fainting, Headaches, Numbness, Seizures, Tingling, Tremor, Trouble walking and Weakness. Psychiatric Not Present- Anxiety, Bipolar, Change in Sleep Pattern, Depression, Fearful and Frequent crying. Endocrine Not Present- Cold Intolerance, Excessive Hunger, Hair Changes, Heat Intolerance, Hot flashes and New Diabetes. Hematology Present- Blood Thinners. Not Present- Easy Bruising, Excessive bleeding, Gland problems, HIV and Persistent Infections. All other systems negative  Vitals (Diane Herrin RN; 06/08/2020 2:01 PM) 06/08/2020 2:00 PM Weight: 143.13 lb Height: 69in Body Surface Area: 1.79 m Body Mass Index: 21.14 kg/m  Temp.: 98.1F  Pulse: 88 (Regular)  P.OX: 97% (Room air) BP: 164/86(Sitting, Left Arm,  Standard)       Physical Exam (Noemy Hallmon MD; 06/08/2020 2:14 PM) The physical exam findings are as follows: Note: Constitutional: No acute distress, conversant, appears stated   age  Eyes: Anicteric sclerae, moist conjunctiva, no lid lag  Neck: No thyromegaly, trachea midline, no cervical lymphadenopathy  Lungs: Clear to auscultation biilaterally, normal respiratory effot  Cardiovascular: regular rate & rhythm, no murmurs, no peripheal edema, pedal pulses 2+  GI: Soft, no masses or hepatosplenomegaly, non-tender to palpation  MSK: Normal gait, no clubbing cyanosis, edema  Skin: No rashes, palpation reveals normal skin turgor  Psychiatric: Appropriate judgment and insight, oriented to person, place, and time  Abdomen Inspection Hernias - Bilateral - Inguinal hernia - Reducible - Bilateral.    Assessment & Plan Erik Filler MD; 06/08/2020 2:15 PM) BILATERAL INGUINAL HERNIA WITHOUT OBSTRUCTION OR GANGRENE, RECURRENCE NOT SPECIFIED (K40.20) Impression: Patient is a 65 year old male, with history of PVD, bilateral inguinal hernias, status post aortobifem bypass  I long discussion with the patient regards to his smoking. I discussed with them that minimizing his smoking between now and surgery will be helpful. I did discuss with him that with his smoking history is a higher chance of recurrence of his hernias.   1. The patient will like to proceed to the operating room for open bilateral inguinal hernia repair with mesh.  2. I discussed with the patient the signs and symptoms of incarceration and strangulation and the need to proceed to the ER should they occur.  3. I discussed with the patient the risks and benefits of the procedure to include but not limited to: Infection, bleeding, damage to surrounding structures, possible need for further surgery, possible nerve pain, and possible recurrence. The patient was understanding and wishes to proceed.

## 2020-06-23 ENCOUNTER — Other Ambulatory Visit (HOSPITAL_COMMUNITY)
Admission: RE | Admit: 2020-06-23 | Discharge: 2020-06-23 | Disposition: A | Discharge status: Home / Self Care | Payer: 59 | Source: Ambulatory Visit | Attending: General Surgery | Admitting: General Surgery

## 2020-06-23 DIAGNOSIS — Z20822 Contact with and (suspected) exposure to covid-19: Secondary | ICD-10-CM | POA: Diagnosis not present

## 2020-06-23 DIAGNOSIS — Z01812 Encounter for preprocedural laboratory examination: Secondary | ICD-10-CM | POA: Insufficient documentation

## 2020-06-23 LAB — SARS CORONAVIRUS 2 (TAT 6-24 HRS): SARS Coronavirus 2: NEGATIVE

## 2020-06-24 ENCOUNTER — Encounter (HOSPITAL_COMMUNITY): Payer: Self-pay | Admitting: General Surgery

## 2020-06-24 NOTE — Anesthesia Preprocedure Evaluation (Addendum)
Anesthesia Evaluation  Patient identified by MRN, date of birth, ID band Patient awake    Reviewed: Allergy & Precautions, NPO status , Patient's Chart, lab work & pertinent test results  Airway Mallampati: I  TM Distance: >3 FB Neck ROM: Full    Dental  (+) Teeth Intact, Dental Advisory Given   Pulmonary neg pulmonary ROS, Current Smoker and Patient abstained from smoking.,    Pulmonary exam normal breath sounds clear to auscultation       Cardiovascular + CAD, + Past MI and + CABG  Normal cardiovascular exam Rhythm:Regular Rate:Normal  MI 2004, s/p CABG: LIMA-LAD, SVG-PAD, SVG-OM1-distal CX; NSTEMI with occluded SVG-OM1, medical therapy 07/03/10   Cardiac cath 05/20/19: Coronary Angiography  1. Left Main -normal  2. Left anterior descending artery -100% proximal  3. Left Circumflex -100% proximal  4. Right Coronary Artery -100% proximal  5. LIMA to the LAD is widely patent and gives collaterals to the inferior wall and RCA  6. Saphenous vein graft to the obtuse marginal has a 25% distal anastomosis insertion; this gives collaterals to obtuse marginal 2  7. Saphenous vein graft to the PDA is occluded  Hemodynamics  1. Aortic Pressure -110/62 mmHg  2. Left Ventricular -110/5 mmHg   CONCLUSIONS:  1. Successful transfemoral cardiac catheterization  2. Obstructive three-vessel coronary artery disease with 2 out of 3 bypass grafts patent  3. Left ventricular end diastolic pressure 5   RECOMMENDATIONS: The patient's abnormal stress test is due to his inferior  wall graft being down. Fortunately this is collateralized from the LIMA  to the LAD graft. He is not having much angina. He will continue his  secondary prevention medications.   Nuclear stress test 04/30/19 ([facility]): IMPRESSION: Abnormal cardiac perfusion exam for inferior wall infarct with mild peri-infarct ischemia. Prognostically this is a low risk scan    Neuro/Psych negative neurological ROS  negative psych ROS   GI/Hepatic negative GI ROS, Neg liver ROS,   Endo/Other  negative endocrine ROS  Renal/GU negative Renal ROS  negative genitourinary   Musculoskeletal negative musculoskeletal ROS (+)   Abdominal   Peds  Hematology negative hematology ROS (+)   Anesthesia Other Findings   Reproductive/Obstetrics                           Anesthesia Physical Anesthesia Plan  ASA: III  Anesthesia Plan: General   Post-op Pain Management:    Induction: Intravenous  PONV Risk Score and Plan: 1 and Midazolam, Dexamethasone and Ondansetron  Airway Management Planned: Oral ETT  Additional Equipment:   Intra-op Plan:   Post-operative Plan: Extubation in OR  Informed Consent: I have reviewed the patients History and Physical, chart, labs and discussed the procedure including the risks, benefits and alternatives for the proposed anesthesia with the patient or authorized representative who has indicated his/her understanding and acceptance.     Dental advisory given  Plan Discussed with: CRNA  Anesthesia Plan Comments: ( )       Anesthesia Quick Evaluation

## 2020-06-24 NOTE — Progress Notes (Signed)
Spoke with pt for pre-op call. Pt has long hx of CAD with CABG in 2006. Pt's cardiologist is Dr. Vilinda Boehringer. Pt denies any recent chest pain or sob. Pt states he is not diabetic.   Covid test done 06/23/20 and it's negative. Pt states he's been in quarantine since the test was done and understands that he stays in quarantine until he comes to the hospital on Monday.

## 2020-06-24 NOTE — Progress Notes (Signed)
Anesthesia Chart Review: Erik Bridges   Case: [id number] Date/Time: 06/27/20 0915   Procedure: BILATERAL INGUINAL HERNIA REPAIRS WITH MESH (Bilateral )   Anesthesia type: General   Pre-op diagnosis: BILATERAL INGUINAL HERNIAS   [facility]: [facility] ROOM 09 / [facility]   Surgeons: [name], MD      DISCUSSION: Patient is a 65 year old male scheduled for the above procedure.  History includes smoking, CAD (MI 2004, s/p CABG: LIMA-LAD, SVG-PAD, SVG-OM1-distal CX; NSTEMI with occluded SVG-OM1, medical therapy 07/03/10 )  Appears patient seen by primary care 05/06/20, note note currently available. He was last evaluation by cardiologist Dr. Leeann Must on 04/01/19 and had an abnormal but low risk stress test followed by a cardiac cath on 05/20/19 ([facility]) which showed 2 patent grafts with occlusion of SVG-OM/CX with collateralization from the LIMA-LAD graft. Continued medical therapy recommended. Medications include ASA, Toprol, Zocor.   I called and spoke with Mr. Frett. He believes his next cardiology visit is in May 2022. He says he has been doing well from a cardiac standpoint since his heart cath last year. He is compliant with medications and has not had to take Nitro. He denied chest pain, SOB, DOE, syncope, edema, heart racing. He is trying to cut back on smoking. 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 He is wearing bilateral truss to help with discomfort.   06/23/2020 presurgical COVID-19 test negative.  Medical therapy recommended after 05/2019 LHC. He denied any CV symptoms and reports active lifestyle with work/farm. He is a same day work-up and is due for labs and EKG. Anesthesiologist to further evaluate on the day of surgery. Discussed with anesthesiologist Marcene Duos, MD.   VS: For day of surgery   PROVIDERS: PCP is at [facility]. 05/06/20 visit,  newly re-established. Oletha Blend, [place]. Marland Kitchen  Vilinda Boehringer, MD is cardiologist   LABS: For day of surgery.    EKG: Last EKG noted is > 67 year old (SR, low voltage, septal infarct, non-specific lateral T wave abnormalitly 05/20/19 by narrative).    CV: Cardiac cath 05/20/19: Coronary Angiography  1. Left Main -normal  2. Left anterior descending artery -100% proximal  3. Left Circumflex -100% proximal  4. Right Coronary Artery -100% proximal  5. LIMA to the LAD is widely patent and gives collaterals to the inferior  wall and RCA  6. Saphenous vein graft to the obtuse marginal has a 25% distal  anastomosis insertion; this gives collaterals to obtuse marginal 2  7. Saphenous vein graft to the PDA is occluded   Hemodynamics  1. Aortic Pressure -110/62 mmHg  2. Left Ventricular -110/5 mmHg   CONCLUSIONS:  1. Successful transfemoral cardiac catheterization  2. Obstructive three-vessel coronary artery disease with 2 out of 3 bypass  grafts patent  3. Left ventricular end diastolic pressure 5   RECOMMENDATIONS: The patient's abnormal stress test is due to his inferior  wall graft being down. Fortunately this is collateralized from the LIMA  to the LAD graft. He is not having much angina. He will continue his  secondary prevention medications.   Nuclear stress test 04/30/19 ([facility]): IMPRESSION: Abnormal cardiac perfusion exam for inferior wall infarct with mild peri-infarct ischemia. Prognostically this is a low risk scan     Carotid US 02/13/17 ([facility]: Conclusions: RIGHT: No hemodynamically significant ICA stenosis, consistent  with <60%.  LEFT: No hemodynamically significant ICA stenosis, consistent with <60%.    Past Medical History:  Diagnosis Date  . Coronary artery disease   . Myocardial infarction (HCC)    07/2010  . Pneumonia     Past Surgical History:  Procedure Laterality Date  . aorta bifemoral bypass graft    . CARDIAC CATHETERIZATION     . CORONARY ARTERY BYPASS GRAFT     2006---  IONGEXBM WAS SURGEON    MEDICATIONS: No current facility-administered medications for this encounter.   Marland Kitchen aspirin 325 MG tablet  . metoprolol succinate (TOPROL-XL) 25 MG 24 hr tablet  . nitroGLYCERIN (NITROSTAT) 0.4 MG SL tablet  . simvastatin (ZOCOR) 40 MG tablet    Shonna Chock, PA-C Surgical Short Stay/Anesthesiology [facility] Phone [phone] [facility] Phone [phone] 06/24/2020 4:24 PM

## 2020-06-27 ENCOUNTER — Ambulatory Visit (HOSPITAL_COMMUNITY)
Admission: RE | Admit: 2020-06-27 | Discharge: 2020-06-27 | Disposition: A | Discharge status: Home / Self Care | Payer: 59 | Attending: General Surgery | Admitting: General Surgery

## 2020-06-27 ENCOUNTER — Other Ambulatory Visit: Payer: Self-pay

## 2020-06-27 ENCOUNTER — Ambulatory Visit (HOSPITAL_COMMUNITY): Payer: 59 | Admitting: Vascular Surgery

## 2020-06-27 ENCOUNTER — Encounter (HOSPITAL_COMMUNITY): Payer: Self-pay | Admitting: General Surgery

## 2020-06-27 ENCOUNTER — Encounter (HOSPITAL_COMMUNITY)
Admission: RE | Disposition: A | Discharge status: Home / Self Care | Payer: Self-pay | Source: Home / Self Care | Attending: General Surgery

## 2020-06-27 DIAGNOSIS — Z951 Presence of aortocoronary bypass graft: Secondary | ICD-10-CM | POA: Insufficient documentation

## 2020-06-27 DIAGNOSIS — I252 Old myocardial infarction: Secondary | ICD-10-CM | POA: Diagnosis not present

## 2020-06-27 DIAGNOSIS — Z7982 Long term (current) use of aspirin: Secondary | ICD-10-CM | POA: Insufficient documentation

## 2020-06-27 DIAGNOSIS — I739 Peripheral vascular disease, unspecified: Secondary | ICD-10-CM | POA: Insufficient documentation

## 2020-06-27 DIAGNOSIS — K4020 Bilateral inguinal hernia, without obstruction or gangrene, not specified as recurrent: Secondary | ICD-10-CM | POA: Diagnosis not present

## 2020-06-27 DIAGNOSIS — I2510 Atherosclerotic heart disease of native coronary artery without angina pectoris: Secondary | ICD-10-CM | POA: Diagnosis not present

## 2020-06-27 DIAGNOSIS — F17200 Nicotine dependence, unspecified, uncomplicated: Secondary | ICD-10-CM | POA: Insufficient documentation

## 2020-06-27 HISTORY — PX: INGUINAL HERNIA REPAIR: SHX194

## 2020-06-27 HISTORY — DX: Pneumonia, unspecified organism: J18.9

## 2020-06-27 HISTORY — PX: INSERTION OF MESH: SHX5868

## 2020-06-27 LAB — BASIC METABOLIC PANEL
Anion gap: 11 (ref 5–15)
BUN: 8 mg/dL (ref 8–23)
CO2: 20 mmol/L — ABNORMAL LOW (ref 22–32)
Calcium: 9.5 mg/dL (ref 8.9–10.3)
Chloride: 107 mmol/L (ref 98–111)
Creatinine, Ser: 0.85 mg/dL (ref 0.61–1.24)
GFR, Estimated: >60 mL/min (ref >60)
Glucose, Bld: 80 mg/dL (ref 70–99)
Potassium: 4.1 mmol/L (ref 3.5–5.1)
Sodium: 138 mmol/L (ref 135–145)

## 2020-06-27 LAB — CBC
HCT: 51.2 % (ref 39.0–52.0)
Hemoglobin: 17.8 g/dL — ABNORMAL HIGH (ref 13.0–17.0)
MCH: 34.1 pg — ABNORMAL HIGH (ref 26.0–34.0)
MCHC: 34.8 g/dL (ref 30.0–36.0)
MCV: 98.1 fL (ref 80.0–100.0)
Platelets: 193 10*3/uL (ref 150–400)
RBC: 5.22 MIL/uL (ref 4.22–5.81)
RDW: 12.1 % (ref 11.5–15.5)
WBC: 8.8 10*3/uL (ref 4.0–10.5)
nRBC: 0 % (ref 0.0–0.2)

## 2020-06-27 SURGERY — REPAIR, HERNIA, INGUINAL, ADULT
Anesthesia: General | Site: Groin | Laterality: Bilateral

## 2020-06-27 MED ORDER — CHLORHEXIDINE GLUCONATE 0.12 % MT SOLN: 15.0000 mL | Freq: Once | OROMUCOSAL | Status: AC

## 2020-06-27 MED ORDER — CHLORHEXIDINE GLUCONATE 0.12 % MT SOLN
OROMUCOSAL | Status: AC
  Administered 2020-06-27: 15 mL via OROMUCOSAL
  Filled 2020-06-27: qty 15

## 2020-06-27 MED ORDER — TRAMADOL HCL 50 MG PO TABS: 50.0000 mg | ORAL_TABLET | Freq: Four times a day (QID) | ORAL | 0 refills | Status: AC | PRN

## 2020-06-27 MED ORDER — FENTANYL CITRATE (PF) 250 MCG/5ML IJ SOLN
INTRAMUSCULAR | Status: AC
  Filled 2020-06-27: qty 5

## 2020-06-27 MED ORDER — CHLORHEXIDINE GLUCONATE CLOTH 2 % EX PADS: 6.0000 | MEDICATED_PAD | Freq: Once | CUTANEOUS | Status: DC

## 2020-06-27 MED ORDER — ROCURONIUM BROMIDE 10 MG/ML (PF) SYRINGE
PREFILLED_SYRINGE | INTRAVENOUS | Status: DC | PRN
  Administered 2020-06-27 (×2): 30 mg via INTRAVENOUS

## 2020-06-27 MED ORDER — CELECOXIB 200 MG PO CAPS
ORAL_CAPSULE | ORAL | Status: AC
  Administered 2020-06-27: 400 mg via ORAL
  Filled 2020-06-27: qty 2

## 2020-06-27 MED ORDER — CEFAZOLIN SODIUM-DEXTROSE 2-4 GM/100ML-% IV SOLN
INTRAVENOUS | Status: AC
  Filled 2020-06-27: qty 100

## 2020-06-27 MED ORDER — BUPIVACAINE-MELOXICAM ER 200-6 MG/7ML IJ SOLN
INTRAMUSCULAR | Status: DC | PRN
  Administered 2020-06-27: 7 mL
  Administered 2020-06-27: 6 mL

## 2020-06-27 MED ORDER — CELECOXIB 200 MG PO CAPS: 400.0000 mg | ORAL_CAPSULE | ORAL | Status: AC

## 2020-06-27 MED ORDER — FENTANYL CITRATE (PF) 100 MCG/2ML IJ SOLN: 25.0000 ug | INTRAMUSCULAR | Status: DC | PRN

## 2020-06-27 MED ORDER — ACETAMINOPHEN 500 MG PO TABS: 1000.0000 mg | ORAL_TABLET | Freq: Once | ORAL | Status: DC

## 2020-06-27 MED ORDER — MIDAZOLAM HCL 2 MG/2ML IJ SOLN
INTRAMUSCULAR | Status: AC
  Filled 2020-06-27: qty 2

## 2020-06-27 MED ORDER — STERILE WATER FOR IRRIGATION IR SOLN
Status: DC | PRN
  Administered 2020-06-27: 1000 mL

## 2020-06-27 MED ORDER — PROPOFOL 10 MG/ML IV BOLUS
INTRAVENOUS | Status: DC | PRN
  Administered 2020-06-27: 150 mg via INTRAVENOUS

## 2020-06-27 MED ORDER — BUPIVACAINE-MELOXICAM ER 200-6 MG/7ML IJ SOLN
INTRAMUSCULAR | Status: AC
  Filled 2020-06-27: qty 2

## 2020-06-27 MED ORDER — ACETAMINOPHEN 500 MG PO TABS: 1000.0000 mg | ORAL_TABLET | ORAL | Status: AC

## 2020-06-27 MED ORDER — BUPIVACAINE HCL (PF) 0.25 % IJ SOLN
INTRAMUSCULAR | Status: AC
  Filled 2020-06-27: qty 30

## 2020-06-27 MED ORDER — SUGAMMADEX SODIUM 200 MG/2ML IV SOLN
INTRAVENOUS | Status: DC | PRN
  Administered 2020-06-27: 200 mg via INTRAVENOUS

## 2020-06-27 MED ORDER — FENTANYL CITRATE (PF) 100 MCG/2ML IJ SOLN
INTRAMUSCULAR | Status: DC | PRN
  Administered 2020-06-27: 100 ug via INTRAVENOUS
  Administered 2020-06-27 (×2): 50 ug via INTRAVENOUS

## 2020-06-27 MED ORDER — BUPIVACAINE-EPINEPHRINE 0.25% -1:200000 IJ SOLN: INTRAMUSCULAR | Status: DC | PRN

## 2020-06-27 MED ORDER — TRAMADOL HCL 50 MG PO TABS
ORAL_TABLET | ORAL | Status: AC
  Filled 2020-06-27: qty 1

## 2020-06-27 MED ORDER — CEFAZOLIN SODIUM-DEXTROSE 2-4 GM/100ML-% IV SOLN
2.0000 g | INTRAVENOUS | Status: AC
  Administered 2020-06-27: 2 g via INTRAVENOUS

## 2020-06-27 MED ORDER — 0.9 % SODIUM CHLORIDE (POUR BTL) OPTIME
TOPICAL | Status: DC | PRN
  Administered 2020-06-27: 1000 mL

## 2020-06-27 MED ORDER — PROPOFOL 10 MG/ML IV BOLUS
INTRAVENOUS | Status: AC
  Filled 2020-06-27: qty 20

## 2020-06-27 MED ORDER — LIDOCAINE 2% (20 MG/ML) 5 ML SYRINGE
INTRAMUSCULAR | Status: DC | PRN
  Administered 2020-06-27: 60 mg via INTRAVENOUS

## 2020-06-27 MED ORDER — MIDAZOLAM HCL 2 MG/2ML IJ SOLN
INTRAMUSCULAR | Status: DC | PRN
  Administered 2020-06-27: 2 mg via INTRAVENOUS

## 2020-06-27 MED ORDER — BUPIVACAINE HCL (PF) 0.25 % IJ SOLN
INTRAMUSCULAR | Status: DC | PRN
  Administered 2020-06-27: 9 mL
  Administered 2020-06-27: 7 mL

## 2020-06-27 MED ORDER — LACTATED RINGERS IV SOLN: INTRAVENOUS | Status: DC

## 2020-06-27 MED ORDER — ACETAMINOPHEN 500 MG PO TABS
ORAL_TABLET | ORAL | Status: AC
  Administered 2020-06-27: 1000 mg via ORAL
  Filled 2020-06-27: qty 2

## 2020-06-27 MED ORDER — DEXAMETHASONE SODIUM PHOSPHATE 10 MG/ML IJ SOLN
INTRAMUSCULAR | Status: AC
  Filled 2020-06-27: qty 1

## 2020-06-27 MED ORDER — ORAL CARE MOUTH RINSE: 15.0000 mL | Freq: Once | OROMUCOSAL | Status: AC

## 2020-06-27 MED ORDER — DEXAMETHASONE SODIUM PHOSPHATE 10 MG/ML IJ SOLN
4.0000 mg | INTRAMUSCULAR | Status: AC
  Administered 2020-06-27: 4 mg via INTRAVENOUS

## 2020-06-27 MED ORDER — TRAMADOL HCL 50 MG PO TABS
50.0000 mg | ORAL_TABLET | Freq: Once | ORAL | Status: AC
  Administered 2020-06-27: 50 mg via ORAL
  Filled 2020-06-27: qty 1

## 2020-06-27 MED ORDER — ONDANSETRON HCL 4 MG/2ML IJ SOLN
INTRAMUSCULAR | Status: DC | PRN
  Administered 2020-06-27: 4 mg via INTRAVENOUS

## 2020-06-27 MED ORDER — ENSURE PRE-SURGERY PO LIQD: 296.0000 mL | Freq: Once | ORAL | Status: DC

## 2020-06-27 SURGICAL SUPPLY — 46 items
ADH SKN CLS APL DERMABOND .7 (GAUZE/BANDAGES/DRESSINGS) ×1
APL PRP STRL LF DISP 70% ISPRP (MISCELLANEOUS) ×1
BLADE CLIPPER SURG (BLADE) IMPLANT
CANISTER SUCT 3000ML PPV (MISCELLANEOUS) IMPLANT
CHLORAPREP W/TINT 26 (MISCELLANEOUS) ×2 IMPLANT
COVER SURGICAL LIGHT HANDLE (MISCELLANEOUS) ×2 IMPLANT
COVER WAND RF STERILE (DRAPES) ×2 IMPLANT
DERMABOND ADVANCED (GAUZE/BANDAGES/DRESSINGS) ×1
DERMABOND ADVANCED .7 DNX12 (GAUZE/BANDAGES/DRESSINGS) ×1 IMPLANT
DRAIN PENROSE 1/2X12 LTX STRL (WOUND CARE) ×1 IMPLANT
DRAPE LAPAROTOMY TRNSV 102X78 (DRAPES) ×2 IMPLANT
ELECT REM PT RETURN 9FT ADLT (ELECTROSURGICAL) ×2
ELECTRODE REM PT RTRN 9FT ADLT (ELECTROSURGICAL) ×1 IMPLANT
GAUZE 4X4 16PLY RFD (DISPOSABLE) ×2 IMPLANT
GLOVE BIO SURGEON STRL SZ7.5 (GLOVE) ×2 IMPLANT
GLOVE SRG 8 PF TXTR STRL LF DI (GLOVE) ×1 IMPLANT
GLOVE SURG UNDER POLY LF SZ8 (GLOVE) ×2
GOWN STRL REUS W/ TWL LRG LVL3 (GOWN DISPOSABLE) ×1 IMPLANT
GOWN STRL REUS W/ TWL XL LVL3 (GOWN DISPOSABLE) ×1 IMPLANT
GOWN STRL REUS W/TWL LRG LVL3 (GOWN DISPOSABLE) ×2
GOWN STRL REUS W/TWL XL LVL3 (GOWN DISPOSABLE) ×2
KIT BASIN OR (CUSTOM PROCEDURE TRAY) ×2 IMPLANT
KIT TURNOVER KIT B (KITS) ×2 IMPLANT
MESH PARIETEX PROGRIP LEFT (Mesh General) ×1 IMPLANT
MESH PARIETEX PROGRIP RIGHT (Mesh General) ×1 IMPLANT
NDL HYPO 25GX1X1/2 BEV (NEEDLE) ×1 IMPLANT
NEEDLE HYPO 25GX1X1/2 BEV (NEEDLE) ×2 IMPLANT
NS IRRIG 1000ML POUR BTL (IV SOLUTION) ×2 IMPLANT
PACK GENERAL/GYN (CUSTOM PROCEDURE TRAY) ×2 IMPLANT
PAD ARMBOARD 7.5X6 YLW CONV (MISCELLANEOUS) ×4 IMPLANT
PENCIL SMOKE EVACUATOR (MISCELLANEOUS) ×2 IMPLANT
SPONGE INTESTINAL PEANUT (DISPOSABLE) IMPLANT
SUT MNCRL AB 4-0 PS2 18 (SUTURE) ×3 IMPLANT
SUT PROLENE 2 0 SH DA (SUTURE) ×3 IMPLANT
SUT VIC AB 2-0 SH 27 (SUTURE) ×6
SUT VIC AB 2-0 SH 27X BRD (SUTURE) ×1 IMPLANT
SUT VIC AB 2-0 SH 27XBRD (SUTURE) IMPLANT
SUT VIC AB 3-0 SH 27 (SUTURE) ×4
SUT VIC AB 3-0 SH 27XBRD (SUTURE) ×1 IMPLANT
SUT VICRYL AB 2 0 TIES (SUTURE) ×1 IMPLANT
SYR CONTROL 10ML LL (SYRINGE) ×2 IMPLANT
SYRINGE TOOMEY DISP (SYRINGE) ×2 IMPLANT
TOWEL GREEN STERILE (TOWEL DISPOSABLE) ×2 IMPLANT
TOWEL GREEN STERILE FF (TOWEL DISPOSABLE) ×2 IMPLANT
TRAY FOL W/BAG SLVR 16FR STRL (SET/KITS/TRAYS/PACK) ×1 IMPLANT
TRAY FOLEY W/BAG SLVR 16FR LF (SET/KITS/TRAYS/PACK) ×2

## 2020-06-27 NOTE — Anesthesia Procedure Notes (Signed)
Procedure Name: Intubation Date/Time: 06/27/2020 10:00 AM Performed by: Leonor Liv, CRNA Pre-anesthesia Checklist: Patient identified, Emergency Drugs available, Suction available and Patient being monitored Patient Re-evaluated:Patient Re-evaluated prior to induction Oxygen Delivery Method: Circle System Utilized Preoxygenation: Pre-oxygenation with 100% oxygen Induction Type: IV induction Ventilation: Mask ventilation without difficulty Laryngoscope Size: Mac and 4 Grade View: Grade I Tube type: Oral Tube size: 7.5 mm Number of attempts: 1 Airway Equipment and Method: Stylet and Oral airway Placement Confirmation: ETT inserted through vocal cords under direct vision,  positive ETCO2 and breath sounds checked- equal and bilateral Secured at: 23 cm Tube secured with: Tape Dental Injury: Teeth and Oropharynx as per pre-operative assessment

## 2020-06-27 NOTE — Op Note (Signed)
06/27/2020  11:18 AM  PATIENT:  Erik Bridges  65 y.o. male  PRE-OPERATIVE DIAGNOSIS:  BILATERAL INGUINAL HERNIAS  POST-OPERATIVE DIAGNOSIS:  BILATERAL DIRECT  INGUINAL HERNIAS  PROCEDURE:  Procedure(s): BILATERAL INGUINAL HERNIA REPAIRS WITH MESH (Bilateral) INSERTION OF MESH (Bilateral)  SURGEON:  Surgeon(s) and Role:    [name], MD - Primary  ASSISTANTS: Dr. Raina Mina  ANESTHESIA:   local and general  EBL:  5 mL   BLOOD ADMINISTERED:none  DRAINS: none   LOCAL MEDICATIONS USED:  BUPIVICAINE  and OTHER Zenrelef  SPECIMEN:  No Specimen  DISPOSITION OF SPECIMEN:  N/A  COUNTS:  YES  TOURNIQUET:  * No tourniquets in log *  DICTATION: .Dragon Dictation  Details of the procedure: The patient was taken back to the operating room. The patient was placed in supine position with bilateral SCDs in place. The patient was prepped and draped in the usual sterile fashion.  After appropriate anitbiotics were confirmed, a time-out was confirmed and all facts were verified.  Quarter percent Marcaine was used to infiltrate the area of the incision and an ilioinguinal nerve block was also placed.   A 5 cm incision was made just 1 cm superior to the inguinal ligament on the right. Bovie cautery was used to maintain hemostasis dissection is carried down to the external oblique.  A standard incision was made laterally, and the external oblique was bluntly dissected away from the surrounding tissue with Metzenbaum scissors. The external oblique was elevated in the spermatic cord was bluntly dissected away from the surrounding tissue.  The ilioinguinal nerve was identified and ligated with an 2-0 dyed vicryl.   The spermatic cord and the hernia were then bluntly dissected away from the pubic tubercle and a Penrose was placed around the hernia sac in the spermatic cord. The vas deferens was identified and protected at all portions of the case. Dissection of the cremasterics took  place with Bovie cautery. Once the hernia sac was dissected away from the surrrounding cremesteric tissue it was dissceted back to the epigastrics and seen to be direct.   This was not entered. This retracted into the abdomen in the usual fashion.  At this time a right-sided Progrip mesh was then anchored to the pubic tubercle with a 2-0 Prolene.  It was anchored to the shelving edge of the external oblique x 1 and the conjoint tendon cephalad x 1.  The wrap around of the mesh was sutured to the conjoint tendon as well.  The new internal ring did not strangulate the spermatic cord.   The tail was then tucked under the external oblique. At this time the area was irrigated out with sterile saline.  Zenrelef was placed in the sub-oblique space.  The external oblique was reapproximated using a 2-0 Vicryl in a running fashion. Scarpa's fascia was then reapproximated using a 3-0 Vicryl running fashion.  Zenrelef was placed in the subcutaneous space. The skin was then reapproximated with 4 Monocryl in a subcuticular fashion. The skin was then dressed with Dermabond.   We turned our attention to the left side. A 5 cm incision was made just 1 cm superior to the inguinal ligament on the right. Bovie cautery was used to maintain hemostasis dissection is carried down to the external oblique.  A standard incision was made laterally, and the external oblique was bluntly dissected away from the surrounding tissue with Metzenbaum scissors. The external oblique was elevated in the spermatic cord was bluntly dissected away from  the surrounding tissue.  The ilioinguinal nerve was identified and ligated with an 2-0 dyed vicryl.   The spermatic cord and the hernia were then bluntly dissected away from the pubic tubercle and a Penrose was placed around the hernia sac in the spermatic cord. The vas deferens was identified and protected at all portions of the case. Dissection of the cremasterics took place with Bovie cautery. Once  the hernia sac was dissected away from the surrrounding cremesteric tissue it was dissceted back to the epigastrics and seen to be direct.   This was not entered. This retracted into the abdomen in the usual fashion.  At this time a left-sided Progrip mesh was then anchored to the pubic tubercle with a 2-0 Prolene.  It was anchored to the shelving edge of the external oblique x 1 and the conjoint tendon cephalad x 1.  The wrap around of the mesh was sutured to the conjoint tendon as well.  The new internal ring did not strangulate the spermatic cord.   The tail was then tucked under the external oblique. At this time the area was irrigated out with sterile saline.  Zenrelef was placed in the sub-oblique space.  The external oblique was reapproximated using a 2-0 Vicryl in a running fashion. Scarpa's fascia was then reapproximated using a 3-0 Vicryl running fashion.  Zenrelef was placed in the subcutaneous space. The skin was then reapproximated with 4 Monocryl in a subcuticular fashion. The skin was then dressed with Dermabond.   The patient was taken to the recovery room in stable condition.   PLAN OF CARE: Discharge to home after PACU  PATIENT DISPOSITION:  PACU - hemodynamically stable.   Delay start of Pharmacological VTE agent (>24hrs) due to surgical blood loss or risk of bleeding: not applicable

## 2020-06-27 NOTE — Discharge Instructions (Signed)
[facility] _______[facility] °INGUINAL HERNIA REPAIR: POST OP INSTRUCTIONS ° °Always review your discharge instruction sheet given to you by the facility where your surgery was performed. °IF YOU HAVE DISABILITY OR FAMILY LEAVE FORMS, YOU MUST BRING THEM TO THE OFFICE FOR PROCESSING.   °DO NOT GIVE THEM TO YOUR DOCTOR. ° °1. A  prescription for pain medication may be given to you upon discharge.  Take your pain medication as prescribed, if needed.  If narcotic pain medicine is not needed, then you may take acetaminophen (Tylenol) or ibuprofen (Advil) as needed. °2. Take your usually prescribed medications unless otherwise directed. °If you need a refill on your pain medication, please contact your pharmacy.  They will contact our office to request authorization. Prescriptions will not be filled after 5 pm or on week-ends. °3. You should follow a light diet the first 24 hours after arrival home, such as soup and crackers, etc.  Be sure to include lots of fluids daily.  Resume your normal diet the day after surgery. °4.Most patients will experience some swelling and bruising around the umbilicus or in the groin and scrotum.  Ice packs and reclining will help.  Swelling and bruising can take several days to resolve.  °6. It is common to experience some constipation if taking pain medication after surgery.  Increasing fluid intake and taking a stool softener (such as Colace) will usually help or prevent this problem from occurring.  A mild laxative (Milk of Magnesia or Miralax) should be taken according to package directions if there are no bowel movements after 48 hours. °7. Unless discharge instructions indicate otherwise, you may remove your bandages 24-48 hours after surgery, and you may shower at that time.  You may have steri-strips (small skin tapes) in place directly over the incision.  These strips should be left on the skin for 7-10 days.  If your surgeon used skin glue on the incision, you may  shower in 24 hours.  The glue will flake off over the next 2-3 weeks.  Any sutures or staples will be removed at the office during your follow-up visit. °8. ACTIVITIES:  You may resume regular (light) daily activities beginning the next day--such as daily self-care, walking, climbing stairs--gradually increasing activities as tolerated.  You may have sexual intercourse when it is comfortable.  Refrain from any heavy lifting or straining until approved by your doctor. ° °a.You may drive when you are no longer taking prescription pain medication, you can comfortably wear a seatbelt, and you can safely maneuver your car and apply brakes. °b.RETURN TO WORK:   °_____________________________________________ ° °9.You should see your doctor in the office for a follow-up appointment approximately 2-3 weeks after your surgery.  Make sure that you call for this appointment within a day or two after you arrive home to insure a convenient appointment time. °10.OTHER INSTRUCTIONS: _________________________ °   _____________________________________ ° °WHEN TO CALL YOUR DOCTOR: °1. Fever over 101.0 °2. Inability to urinate °3. Nausea and/or vomiting °4. Extreme swelling or bruising °5. Continued bleeding from incision. °6. Increased pain, redness, or drainage from the incision ° °The clinic staff is available to answer your questions during regular business hours.  Please don’t hesitate to call and ask to speak to one of the nurses for clinical concerns.  If you have a medical emergency, go to the nearest emergency room or call 911.  A surgeon from [facility] is always on call at the hospital ° ° °[address], [address], [place], [place]  [address] ? ° [address] [address], [place], [place]   [address] °[phone] ? [phone] ? FAX [phone] °Web site: [email or web address] ° °

## 2020-06-27 NOTE — Interval H&P Note (Signed)
History and Physical Interval Note:  06/27/2020 9:12 AM  Erik Bridges  has presented today for surgery, with the diagnosis of BILATERAL INGUINAL HERNIAS.  The various methods of treatment have been discussed with the patient and family. After consideration of risks, benefits and other options for treatment, the patient has consented to  Procedure(s): BILATERAL INGUINAL HERNIA REPAIRS WITH MESH (Bilateral) as a surgical intervention.  The patient's history has been reviewed, patient examined, no change in status, stable for surgery.  I have reviewed the patient's chart and labs.  Questions were answered to the patient's satisfaction.     [name]

## 2020-06-27 NOTE — Transfer of Care (Signed)
Immediate Anesthesia Transfer of Care Note  Patient: Erik Bridges  Procedure(s) Performed: BILATERAL INGUINAL HERNIA REPAIRS WITH MESH (Bilateral Groin) INSERTION OF MESH (Bilateral Groin)  Patient Location: PACU  Anesthesia Type:General  Level of Consciousness: awake, alert  and oriented  Airway & Oxygen Therapy: Patient Spontanous Breathing and Patient connected to nasal cannula oxygen  Post-op Assessment: Report given to RN, Post -op Vital signs reviewed and stable and Patient moving all extremities  Post vital signs: Reviewed and stable  Last Vitals:  Vitals Value Taken Time  BP 154/94 06/27/20 1130  Temp    Pulse 81 06/27/20 1131  Resp 16 06/27/20 1131  SpO2 100 % 06/27/20 1131  Vitals shown include unvalidated device data.  Last Pain:  Vitals:   06/27/20 0744  TempSrc: Oral  PainSc: 0-No pain      Patients Stated Pain Goal: 3 (06/27/20 0744)  Complications: No complications documented.

## 2020-06-27 NOTE — Anesthesia Postprocedure Evaluation (Signed)
Anesthesia Post Note  Patient: Erik Bridges  Procedure(s) Performed: BILATERAL INGUINAL HERNIA REPAIRS WITH MESH (Bilateral Groin) INSERTION OF MESH (Bilateral Groin)     Patient location during evaluation: PACU Anesthesia Type: General Level of consciousness: awake and alert Pain management: pain level controlled Vital Signs Assessment: post-procedure vital signs reviewed and stable Respiratory status: spontaneous breathing, nonlabored ventilation, respiratory function stable and patient connected to nasal cannula oxygen Cardiovascular status: blood pressure returned to baseline and stable Postop Assessment: no apparent nausea or vomiting Anesthetic complications: no   No complications documented.  Last Vitals:  Vitals:   06/27/20 1200 06/27/20 1215  BP: (!) 156/95 (!) 165/108  Pulse: 74 78  Resp: 15 12  Temp:  (!) 36.4 C  SpO2: 97% 95%    Last Pain:  Vitals:   06/27/20 1215  TempSrc:   PainSc: [name]

## 2020-06-28 ENCOUNTER — Encounter (HOSPITAL_COMMUNITY): Payer: Self-pay | Admitting: General Surgery
